# Patient Record
Sex: Female | Born: 1960 | Race: Black or African American | Hispanic: No | Marital: Single | Smoking: Never smoker
Health system: Southern US, Community
[De-identification: ages and names within clinical notes are randomized; demographics above are authoritative.]

## PROBLEM LIST (undated history)

## (undated) DIAGNOSIS — F419 Anxiety disorder, unspecified: Secondary | ICD-10-CM

## (undated) DIAGNOSIS — E785 Hyperlipidemia, unspecified: Secondary | ICD-10-CM

## (undated) DIAGNOSIS — F329 Major depressive disorder, single episode, unspecified: Secondary | ICD-10-CM

## (undated) DIAGNOSIS — F32A Depression, unspecified: Secondary | ICD-10-CM

## (undated) HISTORY — PX: CYST REMOVAL NECK: SHX6281

## (undated) HISTORY — DX: Major depressive disorder, single episode, unspecified: F32.9

## (undated) HISTORY — DX: Anxiety disorder, unspecified: F41.9

## (undated) HISTORY — DX: Depression, unspecified: F32.A

---

## 2016-12-02 ENCOUNTER — Ambulatory Visit (HOSPITAL_COMMUNITY)
Admission: RE | Admit: 2016-12-02 | Discharge: 2016-12-02 | Disposition: A | Discharge status: Home / Self Care | Payer: Self-pay | Source: Ambulatory Visit | Attending: Nurse Practitioner | Admitting: Nurse Practitioner

## 2016-12-02 ENCOUNTER — Encounter: Payer: Self-pay | Admitting: Nurse Practitioner

## 2016-12-02 ENCOUNTER — Ambulatory Visit: Payer: Self-pay | Attending: Nurse Practitioner | Admitting: Nurse Practitioner

## 2016-12-02 VITALS — BP 95/64 | HR 81 | Temp 98.7°F | Resp 18 | Ht 64.0 in | Wt 132.2 lb

## 2016-12-02 DIAGNOSIS — R51 Headache: Secondary | ICD-10-CM

## 2016-12-02 DIAGNOSIS — R519 Headache, unspecified: Secondary | ICD-10-CM

## 2016-12-02 DIAGNOSIS — F329 Major depressive disorder, single episode, unspecified: Secondary | ICD-10-CM

## 2016-12-02 DIAGNOSIS — F32A Depression, unspecified: Secondary | ICD-10-CM | POA: Insufficient documentation

## 2016-12-02 DIAGNOSIS — F419 Anxiety disorder, unspecified: Secondary | ICD-10-CM
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
## 2016-12-03 ENCOUNTER — Telehealth: Payer: Self-pay

## 2016-12-03 LAB — CBC
HEMATOCRIT: 46.3 % (ref 34.0–46.6)
Hemoglobin: 15 g/dL (ref 11.1–15.9)
MCH: 28.2 pg (ref 26.6–33.0)
MCHC: 32.4 g/dL (ref 31.5–35.7)
MCV: 87 fL (ref 79–97)
Platelets: 231 10*3/uL (ref 150–379)
RBC: 5.32 x10E6/uL — ABNORMAL HIGH (ref 3.77–5.28)
RDW: 13.5 % (ref 12.3–15.4)
WBC: 5.6 10*3/uL (ref 3.4–10.8)

## 2016-12-03 LAB — BASIC METABOLIC PANEL
BUN/Creatinine Ratio: 14 (ref 9–23)
BUN: 10 mg/dL (ref 6–24)
CALCIUM: 9 mg/dL (ref 8.7–10.2)
CO2: 28 mmol/L (ref 20–29)
CREATININE: 0.72 mg/dL (ref 0.57–1.00)
Chloride: 101 mmol/L (ref 96–106)
GFR, EST AFRICAN AMERICAN: 107 mL/min/{1.73_m2} (ref >59)
GFR, EST NON AFRICAN AMERICAN: 93 mL/min/{1.73_m2} (ref >59)
Glucose: 115 mg/dL — ABNORMAL HIGH (ref 65–99)
POTASSIUM: 4.7 mmol/L (ref 3.5–5.2)
Sodium: 142 mmol/L (ref 134–144)

## 2016-12-03 LAB — TSH: TSH: 1.01 u[IU]/mL (ref 0.450–4.500)

## 2016-12-03 NOTE — Telephone Encounter (Signed)
Patient informed on lab result. Patient is aware to continue eating healthy and exercise.   Patient verified DOB.

## 2016-12-03 NOTE — Telephone Encounter (Signed)
-----   Message from Claiborne RiggZelda W Fleming, NP sent at 12/03/2016 11:05 AM EST ----- Please inform patient that laboratory results are essentially normal. Continue healthy eating habit and regular physical exercise at least 3 times a week, 50 minutes each time.

## 2016-12-07 ENCOUNTER — Other Ambulatory Visit: Payer: Self-pay | Admitting: Nurse Practitioner

## 2016-12-07 ENCOUNTER — Telehealth: Payer: Self-pay

## 2016-12-07 MED ORDER — IBUPROFEN 800 MG PO TABS: 800.0000 mg | ORAL_TABLET | Freq: Three times a day (TID) | ORAL | 0 refills | Status: AC | PRN

## 2016-12-07 NOTE — Telephone Encounter (Signed)
Patient informed on imaging result. Patient is aware to prescription been sent to the pharmacy.  Patient verified DOB.

## 2016-12-07 NOTE — Telephone Encounter (Signed)
I just called the patient and she stated that there is no more additional paperwork.
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
## 2016-12-07 NOTE — Telephone Encounter (Signed)
I was waiting for her son to bring the additional paperwork. Did you leave it?

## 2016-12-09 NOTE — Telephone Encounter (Signed)
Ok paperwork will be ready on Monday at the front desk

## 2016-12-16 ENCOUNTER — Ambulatory Visit: Payer: Self-pay | Attending: Nurse Practitioner

## 2017-01-01 ENCOUNTER — Telehealth: Payer: Self-pay | Admitting: Nurse Practitioner

## 2017-01-01 NOTE — Telephone Encounter (Signed)
Pt. Called requesting to speak with her PCP regarding her x-ray results. Pt. States she has questions. Please f/u
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
## 2017-01-06 NOTE — Telephone Encounter (Signed)
Please let Ms. Lequita HaltMorgan know I will call her on Friday around lunchtime. Thank you

## 2017-01-07 NOTE — Telephone Encounter (Signed)
Patient called and wanted to verified what time you will be calling her. Patient informed during lunch time from 12:30 to1:30 pm.

## 2017-01-07 NOTE — Telephone Encounter (Signed)
Left a message for patient stating you will called on Friday around lunchtime.

## 2017-01-08 ENCOUNTER — Other Ambulatory Visit: Payer: Self-pay | Admitting: Nurse Practitioner

## 2017-01-08 DIAGNOSIS — R220 Localized swelling, mass and lump, head: Secondary | ICD-10-CM

## 2017-01-08 NOTE — Telephone Encounter (Signed)
Attempted to call Ms. Sandra Miranda. No answer. LVM

## 2017-01-08 NOTE — Progress Notes (Signed)
Called patient and called radiology to schedule her CT SCAN on Wednesday 01/21/2016 @3 :30 pm at [facility].

## 2017-01-08 NOTE — Progress Notes (Signed)
Noted! Thank you

## 2017-01-11 ENCOUNTER — Ambulatory Visit (HOSPITAL_COMMUNITY): Payer: Self-pay

## 2017-01-20 ENCOUNTER — Ambulatory Visit (HOSPITAL_COMMUNITY)
Admission: RE | Admit: 2017-01-20 | Discharge: 2017-01-20 | Disposition: A | Discharge status: Home / Self Care | Payer: Self-pay | Source: Ambulatory Visit | Attending: Nurse Practitioner | Admitting: Nurse Practitioner

## 2017-01-20 DIAGNOSIS — R220 Localized swelling, mass and lump, head: Secondary | ICD-10-CM | POA: Insufficient documentation

## 2017-01-21 ENCOUNTER — Telehealth: Payer: Self-pay

## 2017-01-21 NOTE — Telephone Encounter (Signed)
-----   Message from Claiborne RiggZelda W Fleming, NP sent at 01/20/2017 10:09 PM EST ----- CT is negative for any fractures

## 2017-01-21 NOTE — Telephone Encounter (Signed)
Patient informed on lab result.   Patient verified DOB.  

## 2017-01-22 ENCOUNTER — Telehealth: Payer: Self-pay | Admitting: Nurse Practitioner

## 2017-01-22 NOTE — Telephone Encounter (Signed)
Patient called and said she would like to speak with you. She would not tell me what is going on.  Please fu with patient.

## 2017-01-29 ENCOUNTER — Telehealth: Payer: Self-pay | Admitting: Nurse Practitioner

## 2017-01-29 NOTE — Telephone Encounter (Signed)
I try to called Pt to talk about the incomplete request for medical records, the form did not have to whom do we need to sent the records and also the date she is requesting was from  01/20/17 to 06/20/17, we are on the month of January , please if the pt call back she need to do another full request for records

## 2017-02-05 ENCOUNTER — Telehealth: Payer: Self-pay | Admitting: Nurse Practitioner

## 2017-02-05 NOTE — Telephone Encounter (Signed)
Patient called asking about [employer] paperwork. Patient stated she needed paperwork back to turn in asap. Patient ststed to call her once it is ready.

## 2017-02-05 NOTE — Telephone Encounter (Signed)
Will route to PCP 

## 2017-03-12 ENCOUNTER — Ambulatory Visit: Payer: Self-pay | Admitting: Nurse Practitioner

## 2017-05-12 ENCOUNTER — Telehealth: Payer: Self-pay | Admitting: Nurse Practitioner

## 2017-05-12 NOTE — Telephone Encounter (Signed)
Patient called asking for a referral for the eye doctor

## 2017-05-14 NOTE — Telephone Encounter (Signed)
Per Deisy, patient have [employer] and orange card till 08/05/2017.

## 2017-05-14 NOTE — Telephone Encounter (Signed)
Does patient have any financial assistance through [facility]?

## 2017-05-17 ENCOUNTER — Other Ambulatory Visit: Payer: Self-pay | Admitting: Nurse Practitioner

## 2017-05-17 DIAGNOSIS — Z0100 Encounter for examination of eyes and vision without abnormal findings: Secondary | ICD-10-CM

## 2017-05-17 NOTE — Telephone Encounter (Signed)
Referral has been made.

## 2017-05-19 ENCOUNTER — Other Ambulatory Visit: Payer: Self-pay | Admitting: Nurse Practitioner

## 2017-05-19 ENCOUNTER — Telehealth: Payer: Self-pay | Admitting: Nurse Practitioner

## 2017-05-19 DIAGNOSIS — K089 Disorder of teeth and supporting structures, unspecified: Secondary | ICD-10-CM

## 2017-05-19 NOTE — Telephone Encounter (Signed)
Dental referral has been placed. It will take several weeks to be processed. She can also contact [employer] and request services or if this is for routine cleaning she can call any of the local community college [employer] programs.

## 2017-05-19 NOTE — Telephone Encounter (Signed)
Will route to PCP 

## 2017-05-19 NOTE — Telephone Encounter (Signed)
Patient called and requested for a dental referral. Please fu at your earliest convenience.

## 2017-05-19 NOTE — Telephone Encounter (Signed)
CMA called patient to inform referral has been made.  Patient understood.

## 2017-10-15 ENCOUNTER — Ambulatory Visit: Payer: Medicare Other | Attending: Nurse Practitioner | Admitting: Nurse Practitioner

## 2017-10-15 ENCOUNTER — Encounter: Payer: Self-pay | Admitting: Nurse Practitioner

## 2017-10-15 VITALS — BP 107/72 | HR 75 | Temp 98.3°F | Ht 64.0 in | Wt 136.6 lb

## 2017-10-15 DIAGNOSIS — Z1231 Encounter for screening mammogram for malignant neoplasm of breast: Secondary | ICD-10-CM

## 2017-10-15 DIAGNOSIS — Z833 Family history of diabetes mellitus: Secondary | ICD-10-CM | POA: Insufficient documentation

## 2017-10-15 DIAGNOSIS — F329 Major depressive disorder, single episode, unspecified: Secondary | ICD-10-CM | POA: Diagnosis not present

## 2017-10-15 DIAGNOSIS — G609 Hereditary and idiopathic neuropathy, unspecified: Secondary | ICD-10-CM

## 2017-10-15 DIAGNOSIS — Z79899 Other long term (current) drug therapy: Secondary | ICD-10-CM | POA: Insufficient documentation

## 2017-10-15 DIAGNOSIS — Z1211 Encounter for screening for malignant neoplasm of colon: Secondary | ICD-10-CM | POA: Diagnosis not present

## 2017-10-15 DIAGNOSIS — F419 Anxiety disorder, unspecified: Secondary | ICD-10-CM | POA: Insufficient documentation

## 2017-10-15 DIAGNOSIS — R413 Other amnesia: Secondary | ICD-10-CM | POA: Diagnosis not present

## 2017-10-15 DIAGNOSIS — Z0000 Encounter for general adult medical examination without abnormal findings: Secondary | ICD-10-CM | POA: Diagnosis not present

## 2017-10-15 DIAGNOSIS — R200 Anesthesia of skin: Secondary | ICD-10-CM | POA: Diagnosis present

## 2017-10-15 NOTE — Patient Instructions (Signed)
Dementia Dementia is the loss of two or more brain functions, such as:  Memory.  Decision making.  Behavior.  Speaking.  Thinking.  Problem solving.  There are many types of dementia. The most common type is called progressive dementia. Progressive dementia gets worse with time and it is irreversible. An example of this type of dementia is Alzheimer disease. What are the causes? This condition may be caused by:  Nerve cell damage in the brain.  Genetic mutations.  Certain medicines.  Multiple small strokes.  An infection, such as chronic meningitis.  A metabolic problem, such as vitamin B12 deficiency or thyroid disease.  Pressure on the brain, such as from a tumor or blood clot.  What are the signs or symptoms? Symptoms of this condition include:  Sudden changes in mood.  Depression.  Problems with balance.  Changes in personality.  Poor short-term memory.  Agitation.  Delusions.  Hallucinations.  Having a hard time: ? Speaking thoughts. ? Finding words. ? Solving problems. ? Doing familiar tasks. ? Understanding familiar ideas.  How is this diagnosed? This condition is diagnosed with an assessment by your health care provider. During this assessment, your health care provider will talk with you and your family, friends, or caregivers about your symptoms. A thorough medical history will be taken, and you will have a physical exam and tests. Tests may include:  Lab tests, such as blood or urine tests.  Imaging tests, such as a CT scan, PET scan, or MRI.  A lumbar puncture. This test involves removing and testing a small amount of the fluid that surrounds the brain and spinal cord.  An electroencephalogram (EEG). In this test, small metal discs are used to measure electrical activity in the brain.  Memory tests, cognitive tests, and neuropsychological tests. These tests evaluate brain function.  How is this treated? Treatment depends on the  cause of the dementia. It may involve taking medicines that may help:  To control the dementia.  To slow down the disease.  To manage symptoms.  In some cases, treating the cause of the dementia can improve symptoms, reverse symptoms, or slow down how quickly the dementia gets worse. Your health care provider can help direct you to support groups, organizations, and other health care providers who can help with decisions about your care. Follow these instructions at home: Medicine  Take over-the-counter and prescription medicines only as told by your health care provider.  Avoid taking medicines that can affect thinking, such as pain or sleeping medicines. Lifestyle   Make healthy lifestyle choices: ? Be physically active as told by your health care provider. ? Do not use any tobacco products, such as cigarettes, chewing tobacco, and e-cigarettes. If you need help quitting, ask your health care provider. ? Eat a healthy diet. ? Practice stress-management techniques when you get stressed. ? Stay social.  Drink enough fluid to keep your urine clear or pale yellow.  Make sure to get quality sleep. These tips can help you to get a good night's rest: ? Avoid napping during the day. ? Keep your sleeping area dark and cool. ? Avoid exercising during the few hours before you go to bed. ? Avoid caffeine products in the evening. General instructions  Work with your health care provider to determine what you need help with and what your safety needs are.  If you were given a bracelet that tracks your location, make sure to wear it.  Keep all follow-up visits as told by your   health care provider. This is important. Contact a health care provider if:  You have any new symptoms.  You have problems with choking or swallowing.  You have any symptoms of a different illness. Get help right away if:  You develop a fever.  You have new or worsening confusion.  You have new or  worsening sleepiness.  You have a hard time staying awake.  You or your family members become concerned for your safety. This information is not intended to replace advice given to you by your health care provider. Make sure you discuss any questions you have with your health care provider. Document Released: 07/01/2000 Document Revised: 05/16/2015 Document Reviewed: 10/03/2014 Elsevier Interactive Patient Education  2018 Elsevier Inc.  

## 2017-10-15 NOTE — Progress Notes (Signed)
Assessment & Plan:  Sandra Miranda was seen today for numbness.  Diagnoses and all orders for this visit:  Idiopathic peripheral neuropathy -     CBC -     CMP14+EGFR -     Magnesium  Colon cancer screening -     Fecal occult blood, imunochemical([facility])  Breast cancer screening by mammogram -     MM 3D SCREEN BREAST BILATERAL; Future  Memory loss -     Lipid panel -     B12 and Folate Panel    Patient has been counseled on age-appropriate routine health concerns for screening and prevention. These are reviewed and up-to-date. Referrals have been placed accordingly. Immunizations are up-to-date or declined.    Subjective:   Chief Complaint  Patient presents with  . Numbness    Pt. stated she have tingling feelings on both of feets. Pt. also stated she sometime forgets things easily.    HPI Sandra Miranda 57 y.o. female presents to office today with complaints of "memory loss" and peripheral neuropathy.   Memory Loss She is here for evaluation and treatment of cognitive problems. She endorses one episode in which she was at the grocery store and she "cashed out" and forgot all of her groceries and left them sitting in the grocery store. Another episode she didn't realize she has placed her groceries on the scanner and asked the person at the register who the groceries belonged to. Onset 3 months ago. She has not started any new medication.  She is able to recall the month, year and day of the week.  The patient identify problems with changes in short term memory. Medication administration: patient self medicates   Functional Assessment:  Activities of Daily Living (ADLs):   She is independent in the following: ambulation, bathing and hygiene, feeding, continence, grooming, toileting and dressing Requires assistance with the following: None Instrumental Activities of Daily Living (IADLs):   She is independent in the following: all IADLS Requires assistance with the  following: None    Neuropathy She endorses tingling and sharp pain in her bilateral feet at night. She uses rubbing alcohol on her feet for the pain to go away. Pain can last for hours. Onset was "a long time ago". Symptoms are currently of moderate severity.  The patient denies lancinating pain, cramping and squeezing. Symptoms are symmetric. Previous treatment has included NONE.   Review of Systems  Constitutional: Negative for fever, malaise/fatigue and weight loss.  HENT: Negative.  Negative for nosebleeds.   Eyes: Negative.  Negative for blurred vision, double vision and photophobia.  Respiratory: Negative.  Negative for cough and shortness of breath.   Cardiovascular: Negative.  Negative for chest pain, palpitations and leg swelling.  Gastrointestinal: Negative.  Negative for heartburn, nausea and vomiting.  Musculoskeletal: Negative.  Negative for myalgias.  Neurological: Positive for tingling. Negative for dizziness, focal weakness, seizures and headaches.  Psychiatric/Behavioral: Positive for depression. Negative for suicidal ideas. The patient is nervous/anxious.     Past Medical History:  Diagnosis Date  . Anxiety   . Depression     History reviewed. No pertinent surgical history.  Family History  Problem Relation Age of Onset  . Diabetes Mother   . Diabetes Father     Social History Reviewed with no changes to be made today.   Outpatient Medications Prior to Visit  Medication Sig Dispense Refill  . Lurasidone HCl (LATUDA) 60 MG TABS Take 60 mg at bedtime by mouth.    . sertraline (  ZOLOFT) 100 MG tablet Take 100 mg daily by mouth.    . traZODone (DESYREL) 50 MG tablet Take 50 mg at bedtime by mouth.    Marland Kitchen ibuprofen (ADVIL,MOTRIN) 800 MG tablet Take 1 tablet (800 mg total) every 8 (eight) hours as needed by mouth. (Patient not taking: Reported on 10/15/2017) 30 tablet 0   No facility-administered medications prior to visit.     Not on File     Objective:      BP 107/72 (BP Location: Left Arm, Patient Position: Sitting, Cuff Size: Normal)   Pulse 75   Temp 98.3 F (36.8 C) (Oral)   Ht _0  (1.626 m)   Wt 136 lb 9.6 oz (62 kg)   LMP  (Approximate)   SpO2 97%   BMI 23.45 kg/m  Wt Readings from Last 3 Encounters:  10/15/17 136 lb 9.6 oz (62 kg)  12/02/16 132 lb 3.2 oz (60 kg)    Physical Exam  Constitutional: She is oriented to person, place, and time. She appears well-developed and well-nourished. She is cooperative.  HENT:  Head: Normocephalic and atraumatic.  Eyes: EOM are normal.  Neck: Normal range of motion.  Cardiovascular: Normal rate, regular rhythm and normal heart sounds. Exam reveals no gallop and no friction rub.  No murmur heard. Pulses:      Dorsalis pedis pulses are 2+ on the right side, and 2+ on the left side.       Posterior tibial pulses are 2+ on the right side, and 2+ on the left side.  Pulmonary/Chest: Effort normal and breath sounds normal. No tachypnea. No respiratory distress. She has no decreased breath sounds. She has no wheezes. She has no rhonchi. She has no rales. She exhibits no tenderness.  Abdominal: Bowel sounds are normal.  Musculoskeletal: Normal range of motion. She exhibits no edema.  Feet:  Right Foot:  Protective Sensation: 10 sites tested. 10 sites sensed.  Skin Integrity: Negative for skin breakdown.  Left Foot:  Protective Sensation: 10 sites sensed.  Skin Integrity: Negative for skin breakdown.  Neurological: She is alert and oriented to person, place, and time. Coordination and gait normal.  Skin: Skin is warm and dry.  Psychiatric: She has a normal mood and affect. Her behavior is normal. Judgment and thought content normal.  Nursing note and vitals reviewed.      Patient has been counseled extensively about nutrition and exercise as well as the importance of adherence with medications and regular follow-up. The patient was given clear instructions to go to ER or return to medical  center if symptoms don't improve, worsen or new problems develop. The patient verbalized understanding.   Follow-up: Return for PAP and physical first available.   Gildardo Pounds, FNP-BC [facility] [place], [place]   10/15/2017, 7:24 PM

## 2017-10-16 LAB — CBC
HEMATOCRIT: 41.9 % (ref 34.0–46.6)
HEMOGLOBIN: 13.4 g/dL (ref 11.1–15.9)
MCH: 27.5 pg (ref 26.6–33.0)
MCHC: 32 g/dL (ref 31.5–35.7)
MCV: 86 fL (ref 79–97)
Platelets: 222 10*3/uL (ref 150–450)
RBC: 4.88 x10E6/uL (ref 3.77–5.28)
RDW: 14.9 % (ref 12.3–15.4)
WBC: 5.1 10*3/uL (ref 3.4–10.8)

## 2017-10-16 LAB — LIPID PANEL
CHOL/HDL RATIO: 4.5 ratio — AB (ref 0.0–4.4)
Cholesterol, Total: 265 mg/dL — ABNORMAL HIGH (ref 100–199)
HDL: 59 mg/dL (ref >39)
LDL Calculated: 144 mg/dL — ABNORMAL HIGH (ref 0–99)
TRIGLYCERIDES: 309 mg/dL — AB (ref 0–149)
VLDL CHOLESTEROL CAL: 62 mg/dL — AB (ref 5–40)

## 2017-10-16 LAB — CMP14+EGFR
ALBUMIN: 4.5 g/dL (ref 3.5–5.5)
ALT: 19 IU/L (ref 0–32)
AST: 21 IU/L (ref 0–40)
Albumin/Globulin Ratio: 1.5 (ref 1.2–2.2)
Alkaline Phosphatase: 61 IU/L (ref 39–117)
BUN / CREAT RATIO: 16 (ref 9–23)
BUN: 13 mg/dL (ref 6–24)
Bilirubin Total: <0.2 mg/dL (ref 0.0–1.2)
CALCIUM: 10.3 mg/dL — AB (ref 8.7–10.2)
CO2: 25 mmol/L (ref 20–29)
CREATININE: 0.83 mg/dL (ref 0.57–1.00)
Chloride: 100 mmol/L (ref 96–106)
GFR calc Af Amer: 89 mL/min/{1.73_m2} (ref >59)
GFR, EST NON AFRICAN AMERICAN: 77 mL/min/{1.73_m2} (ref >59)
GLUCOSE: 55 mg/dL — AB (ref 65–99)
Globulin, Total: 3.1 g/dL (ref 1.5–4.5)
Potassium: 4.8 mmol/L (ref 3.5–5.2)
Sodium: 144 mmol/L (ref 134–144)
Total Protein: 7.6 g/dL (ref 6.0–8.5)

## 2017-10-16 LAB — MAGNESIUM: Magnesium: 2.2 mg/dL (ref 1.6–2.3)

## 2017-10-16 LAB — B12 AND FOLATE PANEL
Folate: 15.1 ng/mL (ref >3.0)
VITAMIN B 12: 370 pg/mL (ref 232–1245)

## 2017-10-17 ENCOUNTER — Other Ambulatory Visit: Payer: Self-pay | Admitting: Nurse Practitioner

## 2017-10-17 MED ORDER — ATORVASTATIN CALCIUM 20 MG PO TABS: 20.0000 mg | ORAL_TABLET | Freq: Every day | ORAL | 3 refills | Status: AC

## 2017-10-20 ENCOUNTER — Telehealth: Payer: Self-pay

## 2017-10-20 ENCOUNTER — Telehealth: Payer: Self-pay | Admitting: Nurse Practitioner

## 2017-10-20 NOTE — Telephone Encounter (Signed)
Patient called to request her lab results, verified DOB and received message left by her nurse. She had no further questions or concerns

## 2017-10-20 NOTE — Telephone Encounter (Signed)
CMA attempt to reach patient to inform on results.  No answer and unable to leave a VM due no mailbox has been set up.  If patient call, please inform:  Magnesium and B12 are normal. Tests show increased cholesterol/lipid levels. Will need to start on statin or cholesterol/lipid lowering medication. Prescription has been sent to the pharmacy. Work on a low fat, heart healthy diet and participate in regular aerobic exercise program by working out at least 150 minutes per week; 5 days a week-30 minutes per day. Avoid red meat, fried foods. junk foods, sodas, sugary drinks, unhealthy snacking, alcohol and smoking.  Drink at least 48oz of water per day and monitor your carbohydrate intake daily.  A letter will be send out to reach patient.

## 2017-10-20 NOTE — Telephone Encounter (Signed)
Thank you :)

## 2017-10-20 NOTE — Telephone Encounter (Signed)
-----   Message from Claiborne Rigg, NP sent at 10/17/2017  7:08 PM EDT ----- Magnesium and B12 are normal. Tests show increased cholesterol/lipid levels. Will need to start on statin or cholesterol/lipid lowering medication. Prescription has been sent to the pharmacy. Work on a low fat, heart healthy diet and participate in regular aerobic exercise program by working out at least 150 minutes per week; 5 days a week-30 minutes per day. Avoid red meat, fried foods. junk foods, sodas, sugary drinks, unhealthy snacking, alcohol and smoking.  Drink at least 48oz of water per day and monitor your carbohydrate intake daily.

## 2017-10-21 LAB — FECAL OCCULT BLOOD, IMMUNOCHEMICAL: FECAL OCCULT BLD: NEGATIVE

## 2017-10-25 ENCOUNTER — Telehealth: Payer: Self-pay

## 2017-10-25 NOTE — Telephone Encounter (Signed)
CMA spoke to patient to inform on results.  Patient understood. Patient verified DOB.  

## 2017-10-25 NOTE — Telephone Encounter (Signed)
-----   Message from Claiborne Rigg, NP sent at 10/23/2017  6:03 PM EDT ----- Stool test is normal. At this time you do not need a colonscopy

## 2017-11-17 ENCOUNTER — Encounter: Payer: Self-pay | Admitting: Nurse Practitioner

## 2017-11-17 ENCOUNTER — Ambulatory Visit (HOSPITAL_BASED_OUTPATIENT_CLINIC_OR_DEPARTMENT_OTHER): Payer: Medicare Other | Admitting: Nurse Practitioner

## 2017-11-17 ENCOUNTER — Other Ambulatory Visit (HOSPITAL_COMMUNITY)
Admission: RE | Admit: 2017-11-17 | Discharge: 2017-11-17 | Disposition: A | Discharge status: Home / Self Care | Payer: Medicare Other | Source: Ambulatory Visit | Attending: Nurse Practitioner | Admitting: Nurse Practitioner

## 2017-11-17 VITALS — BP 102/63 | HR 71 | Temp 97.9°F | Ht 64.0 in | Wt 139.8 lb

## 2017-11-17 DIAGNOSIS — Z01419 Encounter for gynecological examination (general) (routine) without abnormal findings: Secondary | ICD-10-CM | POA: Insufficient documentation

## 2017-11-17 DIAGNOSIS — Z79899 Other long term (current) drug therapy: Secondary | ICD-10-CM | POA: Insufficient documentation

## 2017-11-17 DIAGNOSIS — F419 Anxiety disorder, unspecified: Secondary | ICD-10-CM

## 2017-11-17 DIAGNOSIS — F329 Major depressive disorder, single episode, unspecified: Secondary | ICD-10-CM

## 2017-11-17 NOTE — Progress Notes (Signed)
Assessment & Plan:  [name] was seen today for annual exam.  Diagnoses and all orders for this visit:  Well woman exam with routine gynecological exam -     Cytology - PAP    Patient has been counseled on age-appropriate routine health concerns for screening and prevention. These are reviewed and up-to-date. Referrals have been placed accordingly. Immunizations are up-to-date or declined.    Subjective:   Chief Complaint  Patient presents with  . Annual Exam    Pt. is here for a physical and pap smear.    HPI Sandra Miranda 57 y.o. female presents to office today   Review of Systems  Constitutional: Negative.  Negative for chills, fever, malaise/fatigue and weight loss.  HENT: Negative.  Negative for congestion, hearing loss, sinus pain and sore throat.   Eyes: Negative.  Negative for blurred vision, double vision, photophobia and pain.  Respiratory: Negative.  Negative for cough, sputum production, shortness of breath and wheezing.   Cardiovascular: Negative.  Negative for chest pain and leg swelling.  Gastrointestinal: Negative.  Negative for abdominal pain, constipation, diarrhea, heartburn, nausea and vomiting.  Genitourinary: Negative.   Musculoskeletal: Negative.  Negative for joint pain and myalgias.  Skin: Negative.  Negative for rash.  Neurological: Negative.  Negative for dizziness, tremors, speech change, focal weakness, seizures and headaches.  Endo/Heme/Allergies: Negative.  Negative for environmental allergies.  Psychiatric/Behavioral: Negative.  Negative for depression and suicidal ideas. The patient is not nervous/anxious and does not have insomnia.     Past Medical History:  Diagnosis Date  . Anxiety   . Depression     History reviewed. No pertinent surgical history.  Family History  Problem Relation Age of Onset  . Diabetes Mother   . Diabetes Father     Social History Reviewed with no changes to be made today.   Outpatient Medications  Prior to Visit  Medication Sig Dispense Refill  . atorvastatin (LIPITOR) 20 MG tablet Take 1 tablet (20 mg total) by mouth daily. 90 tablet 3  . Lurasidone HCl (LATUDA) 60 MG TABS Take 60 mg at bedtime by mouth.    . sertraline (ZOLOFT) 100 MG tablet Take 100 mg daily by mouth.    . traZODone (DESYREL) 50 MG tablet Take 50 mg at bedtime by mouth.    Marland Kitchen ibuprofen (ADVIL,MOTRIN) 800 MG tablet Take 1 tablet (800 mg total) every 8 (eight) hours as needed by mouth. (Patient not taking: Reported on 10/15/2017) 30 tablet 0   No facility-administered medications prior to visit.     Not on File     Objective:    BP 102/63 (BP Location: Right Arm, Patient Position: Sitting, Cuff Size: Normal)   Pulse 71   Temp 97.9 F (36.6 C) (Oral)   Ht 5\' 4"  (1.626 m)   Wt 139 lb 12.8 oz (63.4 kg)   SpO2 99%   BMI 24.00 kg/m  Wt Readings from Last 3 Encounters:  11/17/17 139 lb 12.8 oz (63.4 kg)  10/15/17 136 lb 9.6 oz (62 kg)  12/02/16 132 lb 3.2 oz (60 kg)    Physical Exam  Constitutional: She is oriented to person, place, and time. She appears well-developed and well-nourished. No distress.  HENT:  Head: Normocephalic and atraumatic.  Right Ear: External ear normal.  Left Ear: External ear normal.  Nose: Nose normal.  Mouth/Throat: Oropharynx is clear and moist. No oropharyngeal exudate.  Eyes: Pupils are equal, round, and reactive to light. Conjunctivae and EOM are normal. Right  eye exhibits no discharge. Left eye exhibits no discharge. No scleral icterus.  Neck: Normal range of motion. Neck supple. No tracheal deviation present. No thyromegaly present.  Cardiovascular: Normal rate, regular rhythm, normal heart sounds and intact distal pulses. Exam reveals no friction rub.  No murmur heard. Pulmonary/Chest: Effort normal and breath sounds normal. No respiratory distress. She has no decreased breath sounds. She has no wheezes. She has no rhonchi. She has no rales. She exhibits no tenderness.  Right breast exhibits no inverted nipple, no mass, no nipple discharge, no skin change and no tenderness. Left breast exhibits no inverted nipple, no mass, no nipple discharge, no skin change and no tenderness.  Abdominal: Soft. Bowel sounds are normal. She exhibits no distension and no mass. There is no tenderness. There is no rebound and no guarding. Hernia confirmed negative in the right inguinal area and confirmed negative in the left inguinal area.  Genitourinary: Vagina normal. Rectal exam shows no external hemorrhoid, no fissure, no mass and anal tone normal. No labial fusion. There is no rash, tenderness, lesion or injury on the right labia. There is no rash, tenderness, lesion or injury on the left labia. Uterus is deviated. Uterus is not tender. Cervix exhibits no motion tenderness, no discharge and no friability. Right adnexum displays no mass, no tenderness and no fullness. Left adnexum displays no mass, no tenderness and no fullness. No erythema, tenderness or bleeding in the vagina. No vaginal discharge found.  Musculoskeletal: Normal range of motion. She exhibits no edema, tenderness or deformity.  Lymphadenopathy:    She has no cervical adenopathy.  Neurological: She is alert and oriented to person, place, and time. No cranial nerve deficit. Coordination normal.  Skin: Skin is warm and dry. No rash noted. She is not diaphoretic. No erythema. No pallor.  Psychiatric: She has a normal mood and affect. Her behavior is normal. Judgment and thought content normal.       Patient has been counseled extensively about nutrition and exercise as well as the importance of adherence with medications and regular follow-up. The patient was given clear instructions to go to ER or return to medical center if symptoms don't improve, worsen or new problems develop. The patient verbalized understanding.   Follow-up: Return in about 6 months (around 05/19/2018).   Claiborne Rigg, FNP-BC [facility] [place], [place] [phone]   11/17/2017, 2:23 PM

## 2017-11-17 NOTE — Patient Instructions (Signed)
Pap Test Why am I having this test? A pap test is sometimes called a pap smear. It is a screening test that is used to check for signs of cancer of the vagina, cervix, and uterus. The test can also identify the presence of infection or precancerous changes. Your health care provider will likely recommend you have this test done on a regular basis. This test may be done:  Every 3 years, starting at age 59.  Every 5 years, in combination with testing for the presence of human papillomavirus (HPV).  More or less often depending on other medical conditions.  What kind of sample is taken? Using a small cotton swab, plastic spatula, or brush, your health care provider will collect a sample of cells from the surface of your cervix. Your cervix is the opening to your uterus, also called a womb. Secretions from the cervix and vagina may also be collected. How do I prepare for this test?  Be aware of where you are in your menstrual cycle. You may be asked to reschedule the test if you are menstruating on the day of the test.  You may need to reschedule if you have a known vaginal infection on the day of the test.  You may be asked to avoid douching or taking a bath the day before or the day of the test.  Some medicines can cause abnormal test results, such as digitalis and tetracycline. Talk with your health care provider before your test if you take one of these medicines. What do the results mean? Abnormal test results may indicate a number of health conditions. These may include:  Cancer. Although pap test results cannot be used to diagnose cancer of the cervix, vagina, or uterus, they may suggest the possibility of cancer. Further tests would be required to determine if cancer is present.  Sexually transmitted disease.  Fungal infection.  Parasite infection.  Herpes infection.  A condition causing or contributing to infertility.  It is your responsibility to obtain your test results.  Ask the lab or department performing the test when and how you will get your results. Contact your health care provider to discuss any questions you have about your results. Talk with your health care provider to discuss your results, treatment options, and if necessary, the need for more tests. Talk with your health care provider if you have any questions about your results. This information is not intended to replace advice given to you by your health care provider. Make sure you discuss any questions you have with your health care provider. Document Released: 03/28/2002 Document Revised: 09/11/2015 Document Reviewed: 05/29/2013 Elsevier Interactive Patient Education  2018 Elsevier Inc.  Preventing Cervical Cancer Cervical cancer is cancer that grows on the cervix. The cervix is at the bottom of the uterus. It connects the uterus to the vagina. The uterus is where a baby develops during pregnancy. Cancer occurs when cells become abnormal and start to grow out of control. Cervical cancer grows slowly and may not cause any symptoms at first. Over time, the cancer can grow deep into the cervix tissue and spread to other areas. If it is found early, cervical cancer can be treated effectively. You can also take steps to prevent this type of cancer. Most cases of cervical cancer are caused by an STI (sexually transmitted infection) called human papillomavirus (HPV). One way to reduce your risk of cervical cancer is to avoid infection with the HPV virus. You can do this by practicing safe   sex and by getting the HPV vaccine. Getting regular Pap tests is also important because this can help identify changes in cells that could lead to cancer. Your chances of getting this disease can also be reduced by making certain lifestyle changes. How can I protect myself from cervical cancer? Preventing HPV infection  Ask your health care provider about getting the HPV vaccine. If you are 26 years old or younger, you may  need to get this vaccine, which is given in three doses over 6 months. This vaccine protects against the types of HPV that could cause cancer.  Limit the number of people you have sex with. Also avoid having sex with people who have had many sex partners.  Use a latex condom during sex. Getting Pap tests  Get Pap tests regularly, starting at age 59. Talk with your health care provider about how often you need these tests. ? Most women who are 21?59 years of age should have a Pap test every 3 years. ? Most women who are 30?59 years of age should have a Pap test in combination with an HPV test every 5 years. ? Women with a higher risk of cervical cancer, such as those with a weakened immune system or those who have been exposed to the drug diethylstilbestrol (DES), may need more frequent testing. Making other lifestyle changes  Do not use any products that contain nicotine or tobacco, such as cigarettes and e-cigarettes. If you need help quitting, ask your health care provider.  Eat at least 5 servings of fruits and vegetables every day.  Lose weight if you are overweight. Why are these changes important?  These changes and screening tests are designed to address the factors that are known to increase the risk of cervical cancer. Taking these steps is the best way to reduce your risk.  Having regular Pap tests will help identify changes in cells that could lead to cancer. Steps can then be taken to prevent cancer from developing.  These changes will also help find cervical cancer early. This type of cancer can be treated effectively if it is found early. It can be more dangerous and difficult to treat if cancer has grown deep into your cervix or has spread.  In addition to making you less likely to get cervical cancer, these changes will also provide other health benefits, such as the following: ? Practicing safe sex is important for preventing STIs and unplanned pregnancies. ? Avoiding  tobacco can reduce your risk for other cancers and health issues. ? Eating a healthy diet and maintaining a healthy weight are good for your overall health. What can happen if changes are not made? In the early stages, cervical cancer might not have any symptoms. It can take many years for the cancer to grow and get deep into the cervix tissue. This may be happening without you knowing about it. If you develop any symptoms, such as pelvic pain or unusual discharge or bleeding from your vagina, you should see your health care provider right away. If cervical cancer is not found early, you might need treatments such as radiation, chemotherapy, or surgery. In some cases, surgery may mean that you will not be able to get pregnant or carry a pregnancy to term. Where to find support: Talk with your health care provider, school nurse, or local health department for guidance about screening and vaccination. Some children and teens may be able to get the HPV vaccine free of charge through the U.S. government's   Vaccines for Children (VFC) program. Other places that provide vaccinations include:  Public health clinics. Check with your local health department.  Federally Qualified Health Centers, where you would pay only what you can afford. To find one near you, check this website: www.fqhc.org/find-an-fqhc/  Rural Health Clinics. These are part of a program for Medicare and Medicaid patients who live in rural areas.  The National Breast and Cervical Cancer Early Detection Program also provides breast and cervical cancer screenings and diagnostic services to low-income, uninsured, and underinsured women. Cervical cancer can be passed down through families. Talk with your health care provider or genetic counselor to learn more about genetic testing for cancer. Where to find more information: Learn more about cervical cancer from:  American College of Gynecology:  www.acog.org/Patients/FAQs/Cervical-Cancer  American Cancer Society: www.cancer.org/cancer/cervicalcancer/  U.S. Centers for Disease Control and Prevention: www.cdc.gov/cancer/cervical/  Summary  Talk with your health care provider about getting the HPV vaccine.  Be sure to get regular Pap tests as recommended by your health care provider.  See your health care provider right away if you have any pelvic pain or unusual discharge or bleeding from your vagina. This information is not intended to replace advice given to you by your health care provider. Make sure you discuss any questions you have with your health care provider. Document Released: 01/20/2015 Document Revised: 09/03/2015 Document Reviewed: 09/03/2015 Elsevier Interactive Patient Education  2018 Elsevier Inc.  

## 2017-11-19 LAB — CYTOLOGY - PAP
Bacterial vaginitis: NEGATIVE
CHLAMYDIA, DNA PROBE: NEGATIVE
Candida vaginitis: NEGATIVE
Diagnosis: NEGATIVE
HPV (WINDOPATH): NOT DETECTED
NEISSERIA GONORRHEA: NEGATIVE
TRICH (WINDOWPATH): NEGATIVE

## 2017-11-23 ENCOUNTER — Telehealth: Payer: Self-pay

## 2017-11-23 NOTE — Telephone Encounter (Signed)
-----   Message from Claiborne Rigg, NP sent at 11/19/2017  3:12 PM EDT ----- PAP smear is normal. Next PAP due 2022

## 2017-11-23 NOTE — Telephone Encounter (Signed)
CMA spoke to patient to inform on results.   Pt. Verified DOB.  Pt.understood.  

## 2017-12-02 ENCOUNTER — Ambulatory Visit
Admission: RE | Admit: 2017-12-02 | Discharge: 2017-12-02 | Disposition: A | Discharge status: Home / Self Care | Payer: Medicare Other | Source: Ambulatory Visit | Attending: Nurse Practitioner | Admitting: Nurse Practitioner

## 2017-12-02 DIAGNOSIS — Z1231 Encounter for screening mammogram for malignant neoplasm of breast: Secondary | ICD-10-CM

## 2018-03-03 DIAGNOSIS — F251 Schizoaffective disorder, depressive type: Secondary | ICD-10-CM | POA: Diagnosis not present

## 2018-03-03 DIAGNOSIS — F209 Schizophrenia, unspecified: Secondary | ICD-10-CM | POA: Diagnosis not present

## 2018-03-03 DIAGNOSIS — F22 Delusional disorders: Secondary | ICD-10-CM | POA: Diagnosis not present

## 2018-03-03 DIAGNOSIS — F333 Major depressive disorder, recurrent, severe with psychotic symptoms: Secondary | ICD-10-CM | POA: Diagnosis not present

## 2018-03-21 ENCOUNTER — Encounter (HOSPITAL_COMMUNITY): Payer: Self-pay | Admitting: *Deleted

## 2018-03-21 ENCOUNTER — Emergency Department (HOSPITAL_COMMUNITY)
Admission: EM | Admit: 2018-03-21 | Discharge: 2018-03-21 | Disposition: A | Discharge status: Home / Self Care | Payer: Medicare Other | Attending: Emergency Medicine | Admitting: Emergency Medicine

## 2018-03-21 ENCOUNTER — Other Ambulatory Visit: Payer: Self-pay

## 2018-03-21 DIAGNOSIS — F419 Anxiety disorder, unspecified: Secondary | ICD-10-CM

## 2018-03-21 DIAGNOSIS — R457 State of emotional shock and stress, unspecified: Secondary | ICD-10-CM | POA: Diagnosis not present

## 2018-03-21 DIAGNOSIS — Z79899 Other long term (current) drug therapy: Secondary | ICD-10-CM | POA: Insufficient documentation

## 2018-03-21 DIAGNOSIS — R000 Tachycardia, unspecified: Secondary | ICD-10-CM | POA: Diagnosis not present

## 2018-03-21 DIAGNOSIS — I10 Essential (primary) hypertension: Secondary | ICD-10-CM | POA: Diagnosis not present

## 2018-03-21 LAB — CBG MONITORING, ED: GLUCOSE-CAPILLARY: 74 mg/dL (ref 70–99)

## 2018-03-21 MED ORDER — LORAZEPAM 2 MG/ML IJ SOLN
1.0000 mg | Freq: Once | INTRAMUSCULAR | Status: AC
  Administered 2018-03-21: 1 mg via INTRAMUSCULAR
  Filled 2018-03-21: qty 1

## 2018-03-21 NOTE — ED Provider Notes (Signed)
[facility] EMERGENCY DEPARTMENT Provider Note   CSN: [id number] Arrival date & time: 03/21/18  1548    History   Chief Complaint Chief Complaint  Patient presents with  . Anxiety    HPI Sandra Miranda is a 58 y.o. female.     58 year old female presents with anxiety attack that began after she had an argument with her relative.  Has a history of same and has not been compliant with her medications.  Denies any suicidal or homicidal ideations.  Has had increasing stress to her current living situation as well 2.  Began to hyperventilate and called EMS and was transported here.     Past Medical History:  Diagnosis Date  . Anxiety   . Depression     Patient Active Problem List   Diagnosis Date Noted  . Anxiety and depression 12/02/2016    History reviewed. No pertinent surgical history.   OB History   No obstetric history on file.      Home Medications    Prior to Admission medications   Medication Sig Start Date End Date Taking? Authorizing Provider  atorvastatin (LIPITOR) 20 MG tablet Take 1 tablet (20 mg total) by mouth daily. 10/17/17   Claiborne Rigg, NP  ibuprofen (ADVIL,MOTRIN) 800 MG tablet Take 1 tablet (800 mg total) every 8 (eight) hours as needed by mouth. Patient not taking: Reported on 10/15/2017 12/07/16   Claiborne Rigg, NP  Lurasidone HCl (LATUDA) 60 MG TABS Take 60 mg at bedtime by mouth.    [provider]  sertraline (ZOLOFT) 100 MG tablet Take 100 mg daily by mouth.    [provider]  traZODone (DESYREL) 50 MG tablet Take 50 mg at bedtime by mouth.    [provider]    Family History Family History  Problem Relation Age of Onset  . Diabetes Mother   . Diabetes Father     Social History Social History   Tobacco Use  . Smoking status: Never Smoker  . Smokeless tobacco: Never Used  Substance Use Topics  . Alcohol use: No    Frequency: Never  . Drug use: No     Allergies     Patient has no known allergies.   Review of Systems Review of Systems  All other systems reviewed and are negative.    Physical Exam Updated Vital Signs BP 113/68 (BP Location: Right Arm)   Pulse 84   Temp 97.8 F (36.6 C) (Oral)   Resp 19   SpO2 100%   Physical Exam Vitals signs and nursing note reviewed.  Constitutional:      General: She is not in acute distress.    Appearance: Normal appearance. She is well-developed. She is not toxic-appearing.  HENT:     Head: Normocephalic and atraumatic.  Eyes:     General: Lids are normal.     Conjunctiva/sclera: Conjunctivae normal.     Pupils: Pupils are equal, round, and reactive to light.  Neck:     Musculoskeletal: Normal range of motion and neck supple.     Thyroid: No thyroid mass.     Trachea: No tracheal deviation.  Cardiovascular:     Rate and Rhythm: Normal rate and regular rhythm.     Heart sounds: Normal heart sounds. No murmur. No gallop.   Pulmonary:     Effort: Pulmonary effort is normal. No respiratory distress.     Breath sounds: Normal breath sounds. No stridor. No decreased breath sounds, wheezing, rhonchi  or rales.  Abdominal:     General: Bowel sounds are normal. There is no distension.     Palpations: Abdomen is soft.     Tenderness: There is no abdominal tenderness. There is no rebound.  Musculoskeletal: Normal range of motion.        General: No tenderness.  Skin:    General: Skin is warm and dry.     Findings: No abrasion or rash.  Neurological:     Mental Status: She is alert and oriented to person, place, and time.     GCS: GCS eye subscore is 4. GCS verbal subscore is 5. GCS motor subscore is 6.     Cranial Nerves: No cranial nerve deficit.     Sensory: No sensory deficit.  Psychiatric:        Mood and Affect: Mood is anxious.        Speech: Speech is rapid and pressured.        Behavior: Behavior is hyperactive.        Thought Content: Thought content does not include homicidal or  suicidal ideation. Thought content does not include homicidal or suicidal plan.      ED Treatments / Results  Labs (all labs ordered are listed, but only abnormal results are displayed) Labs Reviewed - No data to display  EKG None  Radiology No results found.  Procedures Procedures (including critical care time)  Medications Ordered in ED Medications  LORazepam (ATIVAN) injection 1 mg (has no administration in time range)     Initial Impression / Assessment and Plan / ED Course  I have reviewed the triage vital signs and the nursing notes.  Pertinent labs & imaging results that were available during my care of the patient were reviewed by me and considered in my medical decision making (see chart for details).        Patient given Ativan here and feels better.  Reassessed and she is much more calm and relaxed.  Denies any SI or HI.  No evidence of psychosis.  Will be discharged home and she will see her therapist this week.  Final Clinical Impressions(s) / ED Diagnoses   Final diagnoses:  None    ED Discharge Orders    None       Lorre Nick, MD 03/21/18 1806

## 2018-03-21 NOTE — ED Triage Notes (Signed)
Pt transported from home, reports anxiety attack.  Pt has hx of same.  Paramedics reports pt's son is hovering over the pt, and tries to answer questions for pt.  When this nurse assisted pt to the BR, pt reports they are moving d/t "bad neighbors and wants to hurt [place], especially my son."  She reports that her son is stressing a lot about this and has threatened to leave her and her daughter, she states "I don't think I can make it if he leaves me."

## 2018-03-21 NOTE — ED Notes (Signed)
Pt refusing to give this RN further information/ explain what is going on.

## 2018-03-21 NOTE — ED Notes (Signed)
Pts son continuously asking multiple people in hall for a cup when told we would grab it for him in just a moment. Son now went to vending machine and pts daughter is attempting to leave room.

## 2018-03-25 DIAGNOSIS — F251 Schizoaffective disorder, depressive type: Secondary | ICD-10-CM | POA: Diagnosis not present

## 2018-05-09 DIAGNOSIS — F333 Major depressive disorder, recurrent, severe with psychotic symptoms: Secondary | ICD-10-CM | POA: Diagnosis not present

## 2018-05-24 DIAGNOSIS — F333 Major depressive disorder, recurrent, severe with psychotic symptoms: Secondary | ICD-10-CM | POA: Diagnosis not present

## 2018-05-24 DIAGNOSIS — F22 Delusional disorders: Secondary | ICD-10-CM | POA: Diagnosis not present

## 2018-05-24 DIAGNOSIS — F209 Schizophrenia, unspecified: Secondary | ICD-10-CM | POA: Diagnosis not present

## 2018-05-24 DIAGNOSIS — F251 Schizoaffective disorder, depressive type: Secondary | ICD-10-CM | POA: Diagnosis not present

## 2018-06-07 DIAGNOSIS — F333 Major depressive disorder, recurrent, severe with psychotic symptoms: Secondary | ICD-10-CM | POA: Diagnosis not present

## 2018-06-29 DIAGNOSIS — F22 Delusional disorders: Secondary | ICD-10-CM | POA: Diagnosis not present

## 2018-07-20 DIAGNOSIS — F251 Schizoaffective disorder, depressive type: Secondary | ICD-10-CM | POA: Diagnosis not present

## 2018-08-09 DIAGNOSIS — F22 Delusional disorders: Secondary | ICD-10-CM | POA: Diagnosis not present

## 2018-08-09 DIAGNOSIS — F251 Schizoaffective disorder, depressive type: Secondary | ICD-10-CM | POA: Diagnosis not present

## 2018-08-09 DIAGNOSIS — F333 Major depressive disorder, recurrent, severe with psychotic symptoms: Secondary | ICD-10-CM | POA: Diagnosis not present

## 2018-08-09 DIAGNOSIS — F209 Schizophrenia, unspecified: Secondary | ICD-10-CM | POA: Diagnosis not present

## 2018-08-15 DIAGNOSIS — F251 Schizoaffective disorder, depressive type: Secondary | ICD-10-CM | POA: Diagnosis not present

## 2018-09-08 DIAGNOSIS — F251 Schizoaffective disorder, depressive type: Secondary | ICD-10-CM | POA: Diagnosis not present

## 2018-10-03 DIAGNOSIS — F333 Major depressive disorder, recurrent, severe with psychotic symptoms: Secondary | ICD-10-CM | POA: Diagnosis not present

## 2018-10-03 DIAGNOSIS — F251 Schizoaffective disorder, depressive type: Secondary | ICD-10-CM | POA: Diagnosis not present

## 2018-10-31 DIAGNOSIS — F22 Delusional disorders: Secondary | ICD-10-CM | POA: Diagnosis not present

## 2018-10-31 DIAGNOSIS — F251 Schizoaffective disorder, depressive type: Secondary | ICD-10-CM | POA: Diagnosis not present

## 2018-10-31 DIAGNOSIS — F333 Major depressive disorder, recurrent, severe with psychotic symptoms: Secondary | ICD-10-CM | POA: Diagnosis not present

## 2018-10-31 DIAGNOSIS — F209 Schizophrenia, unspecified: Secondary | ICD-10-CM | POA: Diagnosis not present

## 2019-01-10 ENCOUNTER — Encounter: Payer: Self-pay | Admitting: Podiatry

## 2019-01-10 ENCOUNTER — Ambulatory Visit (INDEPENDENT_AMBULATORY_CARE_PROVIDER_SITE_OTHER): Payer: Medicare Other

## 2019-01-10 ENCOUNTER — Other Ambulatory Visit: Payer: Self-pay | Admitting: Podiatry

## 2019-01-10 ENCOUNTER — Ambulatory Visit (INDEPENDENT_AMBULATORY_CARE_PROVIDER_SITE_OTHER): Payer: Medicare Other | Admitting: Podiatry

## 2019-01-10 ENCOUNTER — Other Ambulatory Visit: Payer: Self-pay

## 2019-01-10 VITALS — BP 120/78 | HR 66

## 2019-01-10 DIAGNOSIS — S93401A Sprain of unspecified ligament of right ankle, initial encounter: Secondary | ICD-10-CM | POA: Diagnosis not present

## 2019-01-10 DIAGNOSIS — M779 Enthesopathy, unspecified: Secondary | ICD-10-CM

## 2019-01-10 MED ORDER — MELOXICAM 15 MG PO TABS: 15.0000 mg | ORAL_TABLET | Freq: Every day | ORAL | 1 refills | Status: AC

## 2019-01-16 NOTE — Progress Notes (Signed)
   Subjective:  58 y.o. female presenting today as a new patient with a chief complaint right ankle pain that began two weeks ago secondary to an injury. She states she twisted her ankle getting out of the bath tub. She notes the pain is located to the medial ankle and radiates up the leg. She reports associated swelling and pain that is exacerbated by applying pressure. She has not done anything at home for treatment. Patient is here for further evaluation and treatment.   Past Medical History:  Diagnosis Date  . Anxiety   . Depression      Objective / Physical Exam:  General:  The patient is alert and oriented x3 in no acute distress. Dermatology:  Skin is warm, dry and supple bilateral lower extremities. Negative for open lesions or macerations. Vascular:  Palpable pedal pulses bilaterally. No erythema noted. Capillary refill within normal limits. Neurological:  Epicritic and protective threshold grossly intact bilaterally.  Musculoskeletal Exam:  Pain on palpation to the anterior lateral medial aspects of the patient's right ankle. Mild edema noted. Range of motion within normal limits to all pedal and ankle joints bilateral. Muscle strength 5/5 in all groups bilateral.   Radiographic Exam:  Normal osseous mineralization. Joint spaces preserved. No fracture/dislocation/boney destruction.    Assessment: 1. Right ankle sprain   Plan of Care:  1. Patient was evaluated. X-Rays reviewed.  2. CAM boot dispensed to use for two weeks.  3. Prescription for Meloxicam provided to patient. 4. Return to clinic as needed.    Edrick Kins, DPM [facility]  Dr. Edrick Kins, [address]                                        [place], [place] [address]                Office [phone]  Fax [phone]

## 2019-01-31 DIAGNOSIS — F22 Delusional disorders: Secondary | ICD-10-CM | POA: Diagnosis not present

## 2019-01-31 DIAGNOSIS — F251 Schizoaffective disorder, depressive type: Secondary | ICD-10-CM | POA: Diagnosis not present

## 2019-01-31 DIAGNOSIS — F333 Major depressive disorder, recurrent, severe with psychotic symptoms: Secondary | ICD-10-CM | POA: Diagnosis not present

## 2019-01-31 DIAGNOSIS — F209 Schizophrenia, unspecified: Secondary | ICD-10-CM | POA: Diagnosis not present

## 2019-02-13 DIAGNOSIS — F333 Major depressive disorder, recurrent, severe with psychotic symptoms: Secondary | ICD-10-CM | POA: Diagnosis not present

## 2019-03-10 DIAGNOSIS — F333 Major depressive disorder, recurrent, severe with psychotic symptoms: Secondary | ICD-10-CM | POA: Diagnosis not present

## 2019-03-10 DIAGNOSIS — F22 Delusional disorders: Secondary | ICD-10-CM | POA: Diagnosis not present

## 2019-04-03 ENCOUNTER — Ambulatory Visit
Admission: EM | Admit: 2019-04-03 | Discharge: 2019-04-03 | Disposition: A | Discharge status: Home / Self Care | Payer: Medicare Other | Attending: Emergency Medicine | Admitting: Emergency Medicine

## 2019-04-03 ENCOUNTER — Other Ambulatory Visit: Payer: Self-pay

## 2019-04-03 ENCOUNTER — Encounter: Payer: Self-pay | Admitting: Emergency Medicine

## 2019-04-03 DIAGNOSIS — H1033 Unspecified acute conjunctivitis, bilateral: Secondary | ICD-10-CM | POA: Diagnosis not present

## 2019-04-03 DIAGNOSIS — H109 Unspecified conjunctivitis: Secondary | ICD-10-CM

## 2019-04-03 HISTORY — DX: Hyperlipidemia, unspecified: E78.5

## 2019-04-03 MED ORDER — POLYMYXIN B-TRIMETHOPRIM 10000-0.1 UNIT/ML-% OP SOLN: 1.0000 [drp] | Freq: Four times a day (QID) | OPHTHALMIC | 0 refills | Status: AC

## 2019-04-03 NOTE — Discharge Instructions (Addendum)
Use the antibiotic eyedrops as prescribed.    Follow-up with your eye doctor for a recheck in 1 to 2 days if your symptoms are not improving.    Go to the emergency department if you have acute eye pain or changes in your vision.    

## 2019-04-03 NOTE — ED Provider Notes (Signed)
Sandra Miranda    CSN: [id number] Arrival date & time: 04/03/19  1605      History   Chief Complaint Chief Complaint  Patient presents with  . Eye Problem    HPI Sandra Miranda is a 59 y.o. female.   Patient presents with 3-day history of bilateral eyelid irritation, itching, redness, discomfort, and swelling; R>L.  She also reports crusting in her eyes in the mornings and tearing throughout the day.  No injury.  No foreign body sensation.  She denies acute eye pain or changes in her vision.  She thought her symptoms may be due to cat dander and attempted treatment with Benadryl without relief.  She denies fever, chills, or other symptoms.    The history is provided by the patient.    Past Medical History:  Diagnosis Date  . Anxiety   . Depression   . Hyperlipidemia     Patient Active Problem List   Diagnosis Date Noted  . Anxiety and depression 12/02/2016    Past Surgical History:  Procedure Laterality Date  . CYST REMOVAL NECK      OB History   No obstetric history on file.      Home Medications    Prior to Admission medications   Medication Sig Start Date End Date Taking? Authorizing Provider  hydrOXYzine (ATARAX/VISTARIL) 25 MG tablet Take 25 mg by mouth 3 (three) times daily as needed.   Yes [provider]  Lurasidone HCl (LATUDA) 60 MG TABS Take 60 mg at bedtime by mouth.   Yes [provider]  sertraline (ZOLOFT) 100 MG tablet Take 100 mg daily by mouth.   Yes [provider]  traZODone (DESYREL) 50 MG tablet Take 50 mg at bedtime by mouth.   Yes [provider]  atorvastatin (LIPITOR) 20 MG tablet Take 1 tablet (20 mg total) by mouth daily. Patient not taking: Reported on 01/10/2019 10/17/17   Claiborne Rigg, NP  ibuprofen (ADVIL,MOTRIN) 800 MG tablet Take 1 tablet (800 mg total) every 8 (eight) hours as needed by mouth. Patient not taking: Reported on 10/15/2017 12/07/16   Claiborne Rigg, NP  meloxicam  (MOBIC) 15 MG tablet Take 1 tablet (15 mg total) by mouth daily. 01/10/19   Felecia Shelling, DPM  trimethoprim-polymyxin b (POLYTRIM) ophthalmic solution Place 1 drop into both eyes 4 (four) times daily for 7 days. 04/03/19 04/10/19  Mickie Bail, NP    Family History Family History  Problem Relation Age of Onset  . Diabetes Mother   . Heart disease Mother   . Diabetes Father   . Prostate cancer Father     Social History Social History   Tobacco Use  . Smoking status: Never Smoker  . Smokeless tobacco: Never Used  Substance Use Topics  . Alcohol use: No  . Drug use: No     Allergies   Patient has no known allergies.   Review of Systems Review of Systems  Constitutional: Negative for chills and fever.  HENT: Negative for ear pain and sore throat.   Eyes: Positive for redness and itching. Negative for pain and visual disturbance.  Respiratory: Negative for cough and shortness of breath.   Cardiovascular: Negative for chest pain and palpitations.  Gastrointestinal: Negative for abdominal pain and vomiting.  Genitourinary: Negative for dysuria and hematuria.  Musculoskeletal: Negative for arthralgias and back pain.  Skin: Negative for color change and rash.  Neurological: Negative for seizures and syncope.  All other systems reviewed  and are negative.    Physical Exam Triage Vital Signs ED Triage Vitals  Enc Vitals Group     BP      Pulse      Resp      Temp      Temp src      SpO2      Weight      Height      Head Circumference      Peak Flow      Pain Score      Pain Loc      Pain Edu?      Excl. in GC?    No data found.  Updated Vital Signs BP 123/78 (BP Location: Left Arm)   Pulse 66   Temp 98.8 F (37.1 C) (Oral)   Resp 18   Ht 5\' 4"  (1.626 m)   Wt 140 lb (63.5 kg)   SpO2 98%   BMI 24.03 kg/m   Visual Acuity Right Eye Distance: 20/30 Left Eye Distance: 20/25 Bilateral Distance: 20/20  Right Eye Near:   Left Eye Near:    Bilateral  Near:     Physical Exam Vitals and nursing note reviewed.  Constitutional:      General: She is not in acute distress.    Appearance: She is well-developed.  HENT:     Head: Normocephalic and atraumatic.     Right Ear: Tympanic membrane normal.     Left Ear: Tympanic membrane normal.     Nose: Nose normal.     Mouth/Throat:     Mouth: Mucous membranes are moist.     Pharynx: Oropharynx is clear.  Eyes:     General: Lids are normal. Vision grossly intact.     Extraocular Movements: Extraocular movements intact.     Conjunctiva/sclera:     Right eye: Right conjunctiva is injected.     Left eye: Left conjunctiva is injected.  Cardiovascular:     Rate and Rhythm: Normal rate and regular rhythm.     Heart sounds: No murmur.  Pulmonary:     Effort: Pulmonary effort is normal. No respiratory distress.     Breath sounds: Normal breath sounds.  Abdominal:     Palpations: Abdomen is soft.     Tenderness: There is no abdominal tenderness. There is no guarding or rebound.  Musculoskeletal:     Cervical back: Neck supple.  Skin:    General: Skin is warm and dry.     Findings: No rash.  Neurological:     General: No focal deficit present.     Mental Status: She is alert and oriented to person, place, and time.      UC Treatments / Results  Labs (all labs ordered are listed, but only abnormal results are displayed) Labs Reviewed - No data to display  EKG   Radiology No results found.  Procedures Procedures (including critical care time)  Medications Ordered in UC Medications - No data to display  Initial Impression / Assessment and Plan / UC Course  I have reviewed the triage vital signs and the nursing notes.  Pertinent labs & imaging results that were available during my care of the patient were reviewed by me and considered in my medical decision making (see chart for details).    Bilateral conjunctivitis.  Treating with polytrim eye drops.  Directed patient to  follow-up with her eye care doctor for recheck in 1 to 2 days if her symptoms or not improving.  Instructed  her to go to the ED if she has acute eye pain or changes in her vision.  Patient agrees to plan of care.     Final Clinical Impressions(s) / UC Diagnoses   Final diagnoses:  Conjunctivitis of both eyes, unspecified conjunctivitis type     Discharge Instructions     Use the antibiotic eyedrops as prescribed.    Follow-up with your eye doctor for a recheck in 1 to 2 days if your symptoms are not improving.      Go to the emergency department if you have acute eye pain or changes in your vision.        ED Prescriptions    Medication Sig Dispense Auth. Provider   trimethoprim-polymyxin b (POLYTRIM) ophthalmic solution Place 1 drop into both eyes 4 (four) times daily for 7 days. 10 mL Sharion Balloon, NP     PDMP not reviewed this encounter.   Sharion Balloon, NP 04/03/19 [phone]

## 2019-04-03 NOTE — ED Triage Notes (Signed)
Patient in today c/o bilateral eye irritation, pain, redness and swelling of the lids x 3 days. R>L Patient took Benadryl without relief.

## 2019-04-10 DIAGNOSIS — F251 Schizoaffective disorder, depressive type: Secondary | ICD-10-CM | POA: Diagnosis not present

## 2019-04-10 DIAGNOSIS — F333 Major depressive disorder, recurrent, severe with psychotic symptoms: Secondary | ICD-10-CM | POA: Diagnosis not present

## 2019-04-25 DIAGNOSIS — F251 Schizoaffective disorder, depressive type: Secondary | ICD-10-CM | POA: Diagnosis not present

## 2019-04-25 DIAGNOSIS — F333 Major depressive disorder, recurrent, severe with psychotic symptoms: Secondary | ICD-10-CM | POA: Diagnosis not present

## 2019-04-25 DIAGNOSIS — F209 Schizophrenia, unspecified: Secondary | ICD-10-CM | POA: Diagnosis not present

## 2019-04-25 DIAGNOSIS — F22 Delusional disorders: Secondary | ICD-10-CM | POA: Diagnosis not present

## 2019-05-25 DIAGNOSIS — F251 Schizoaffective disorder, depressive type: Secondary | ICD-10-CM | POA: Diagnosis not present

## 2019-05-25 DIAGNOSIS — F333 Major depressive disorder, recurrent, severe with psychotic symptoms: Secondary | ICD-10-CM | POA: Diagnosis not present

## 2019-05-31 IMAGING — MG DIGITAL SCREENING BILATERAL MAMMOGRAM WITH TOMO AND CAD
8 series · 9 of 24 positions shown · non-contrast
Comparison: Previous exam(s).

CLINICAL DATA: Screening.

EXAM:
DIGITAL SCREENING BILATERAL MAMMOGRAM WITH TOMO AND CAD

[L MLO synth-2D]
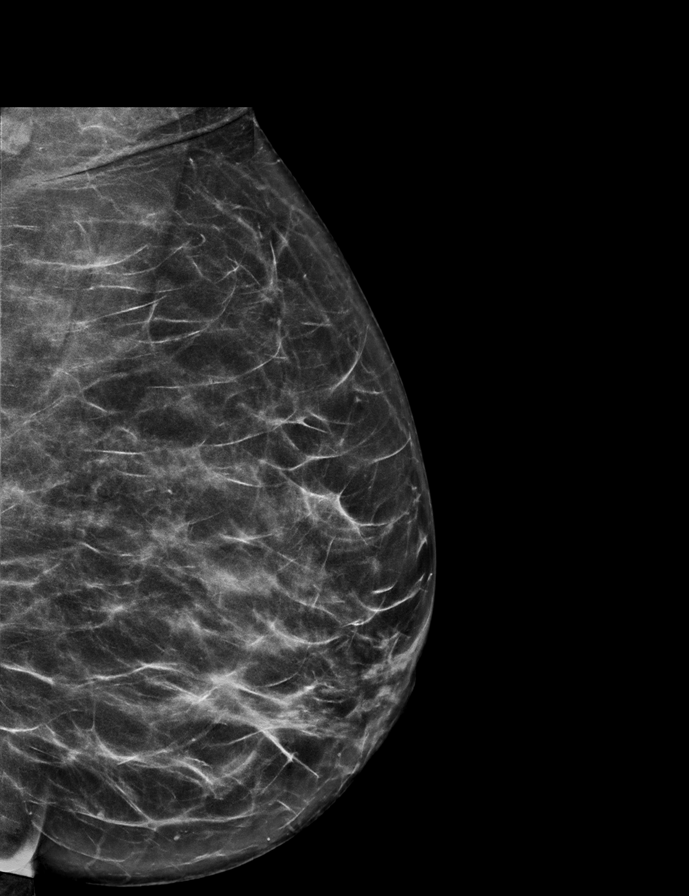

[R CC synth-2D]
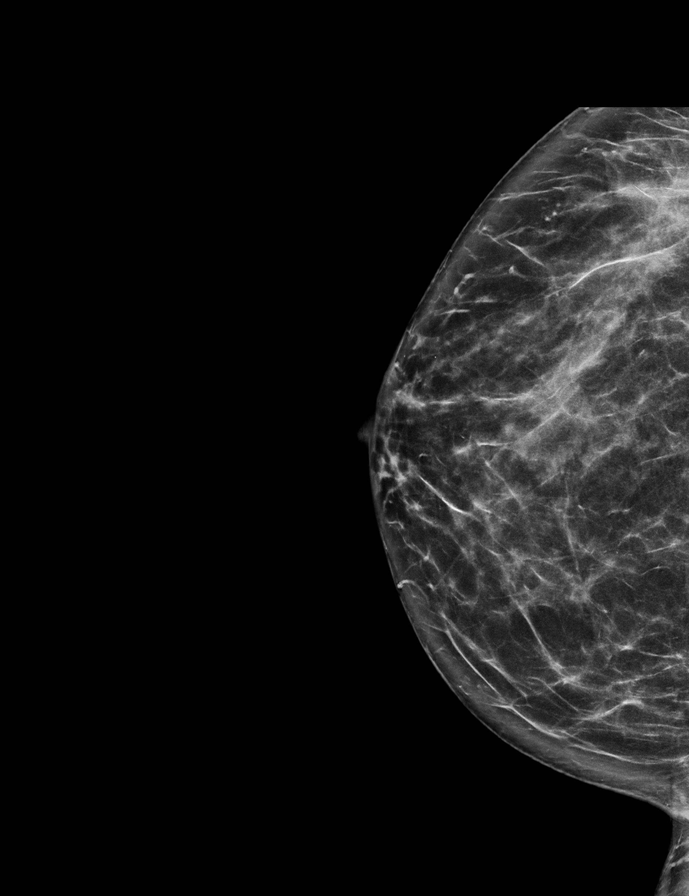

[R MLO synth-2D]
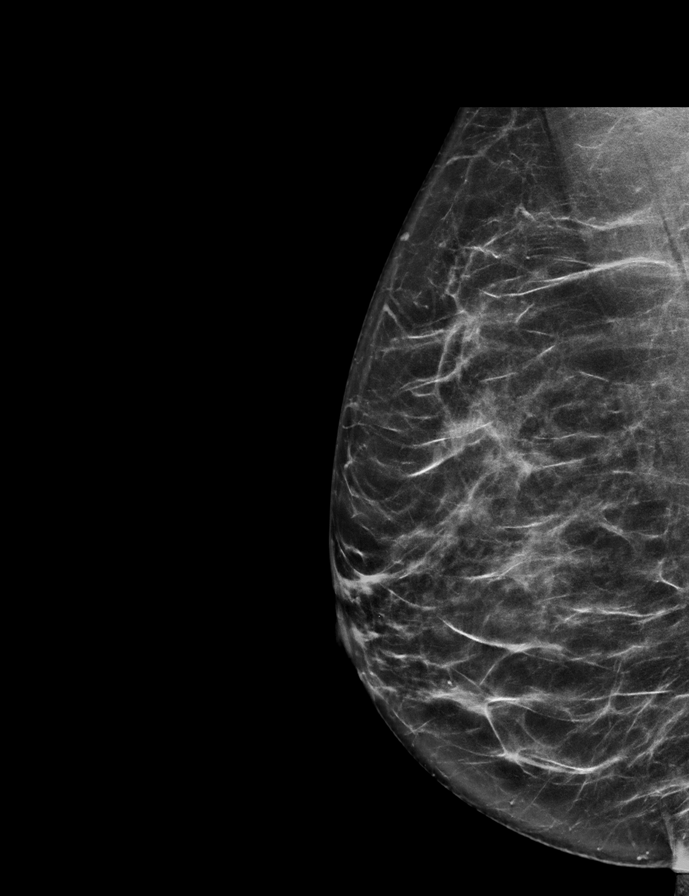

[L CC synth-2D]
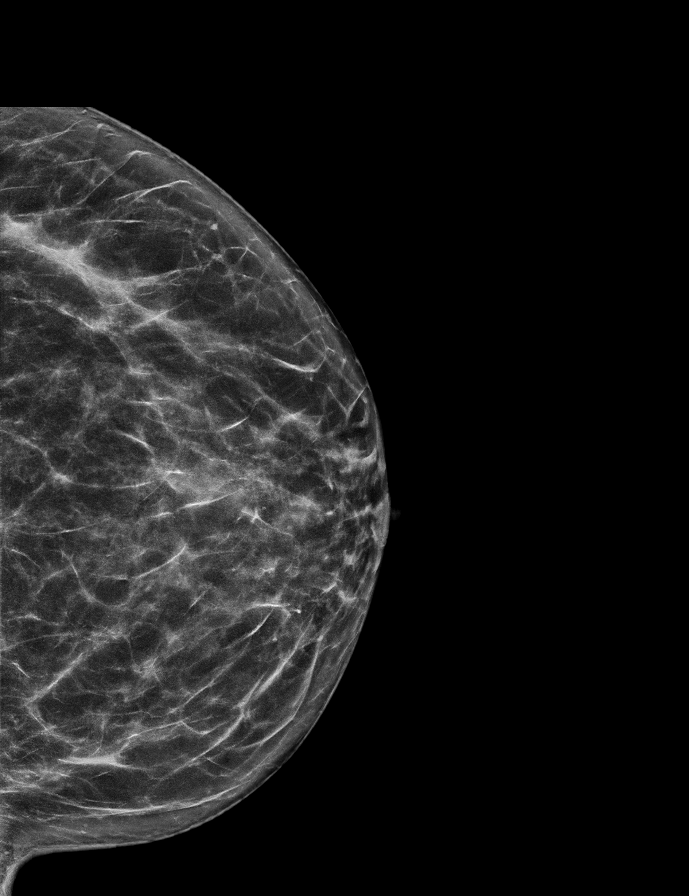

[R MLO tomo · 2 of 68 frames shown]
[frame 22/68]
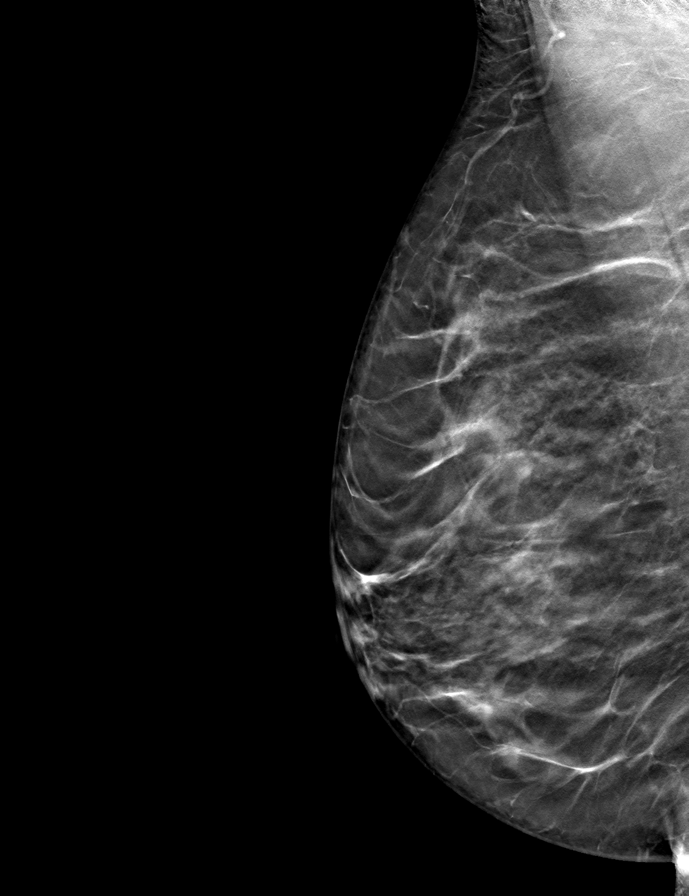
[frame 35/68]
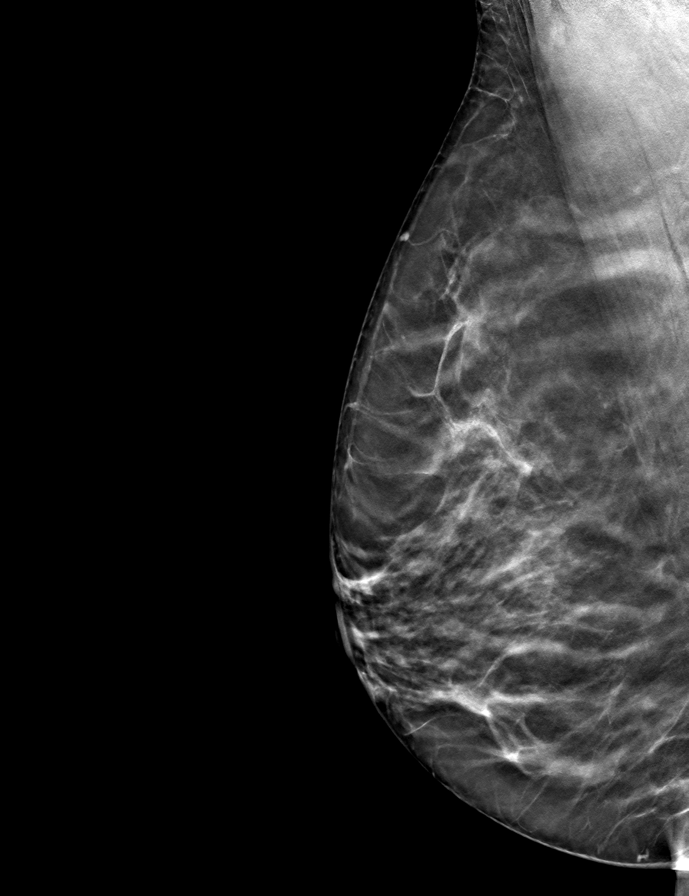

[R CC tomo · tomo slice 33/65.0]
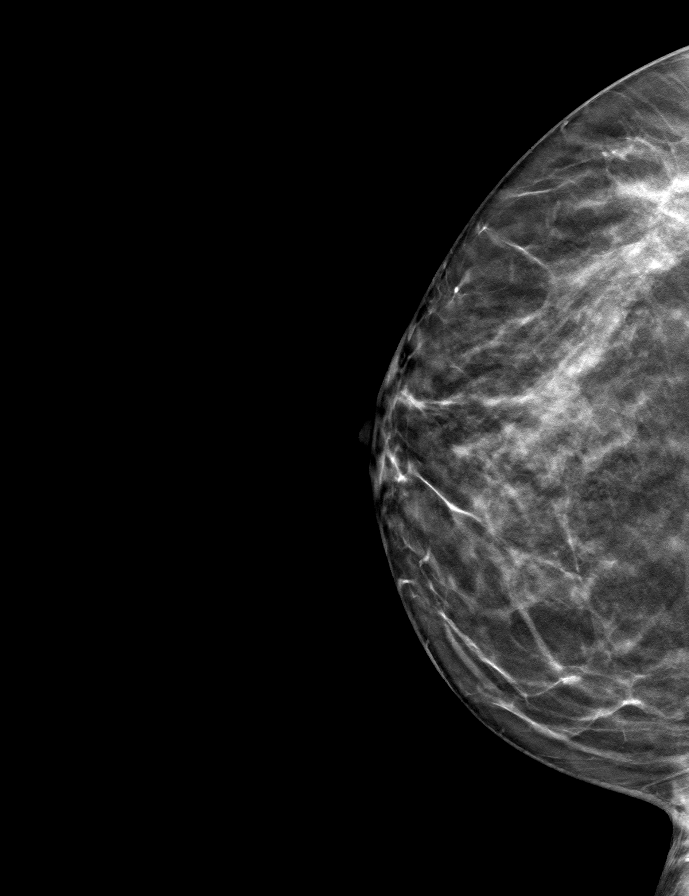

[L CC tomo · tomo slice 33/64.0]
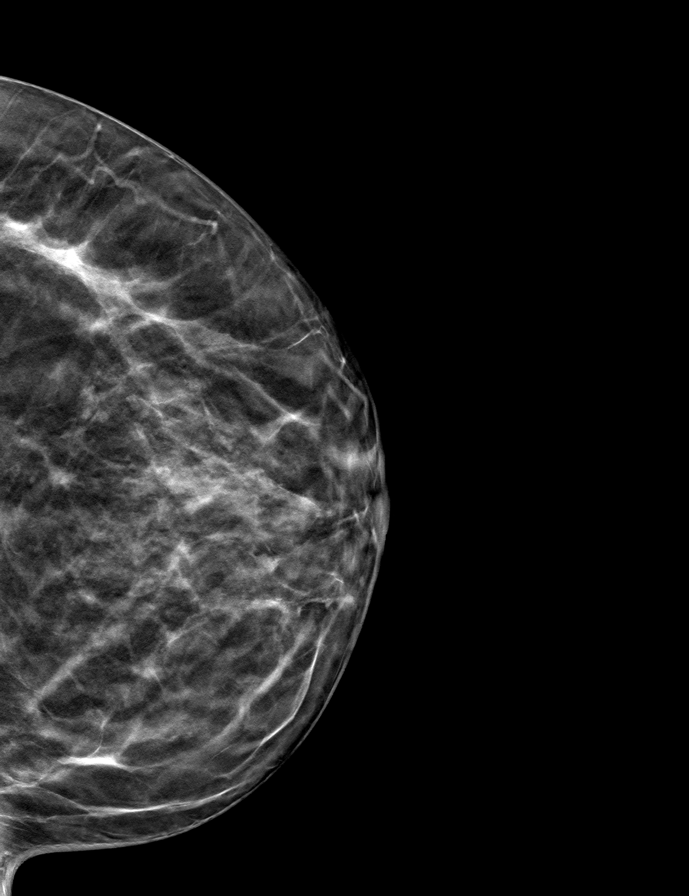

[L MLO tomo · tomo slice 35/69.0]
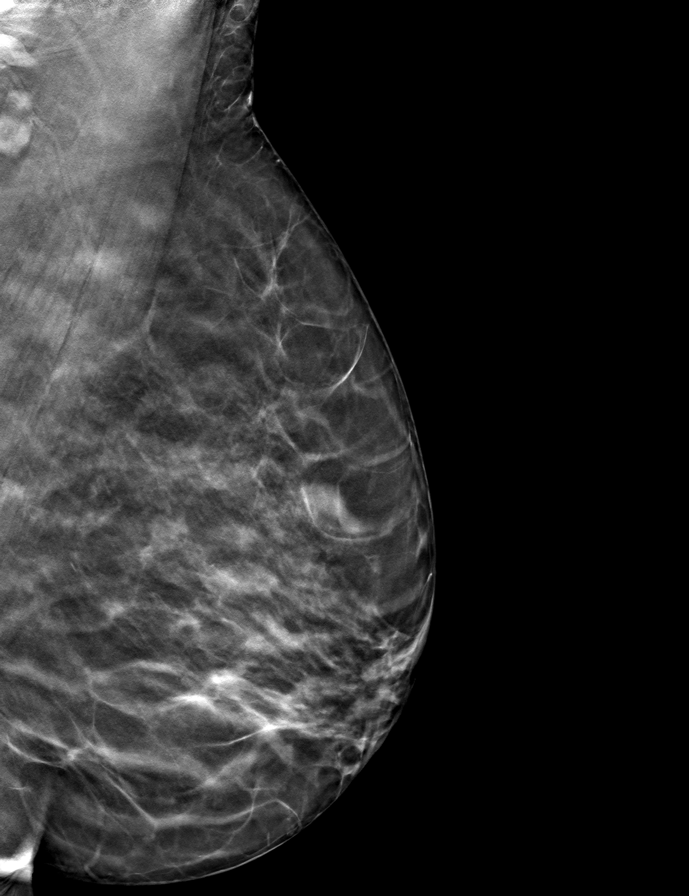

[9 of 24 positions shown; findings below may reference images not displayed]

ACR Breast Density Category b: There are scattered areas of
fibroglandular density.
FINDINGS: There are no findings suspicious for malignancy. Images were
processed with CAD.
IMPRESSION: No mammographic evidence of malignancy. A result letter of this
screening mammogram will be mailed directly to the patient.

RECOMMENDATION:
Screening mammogram in one year. (Code:[id number])

BI-RADS CATEGORY  1: Negative.

## 2019-07-20 DIAGNOSIS — F333 Major depressive disorder, recurrent, severe with psychotic symptoms: Secondary | ICD-10-CM | POA: Diagnosis not present

## 2019-07-20 DIAGNOSIS — F251 Schizoaffective disorder, depressive type: Secondary | ICD-10-CM | POA: Diagnosis not present

## 2019-08-09 DIAGNOSIS — F251 Schizoaffective disorder, depressive type: Secondary | ICD-10-CM | POA: Diagnosis not present

## 2019-08-09 DIAGNOSIS — F333 Major depressive disorder, recurrent, severe with psychotic symptoms: Secondary | ICD-10-CM | POA: Diagnosis not present

## 2019-10-26 DIAGNOSIS — F22 Delusional disorders: Secondary | ICD-10-CM | POA: Diagnosis not present

## 2019-10-26 DIAGNOSIS — F251 Schizoaffective disorder, depressive type: Secondary | ICD-10-CM | POA: Diagnosis not present

## 2019-10-26 DIAGNOSIS — F333 Major depressive disorder, recurrent, severe with psychotic symptoms: Secondary | ICD-10-CM | POA: Diagnosis not present

## 2019-10-26 DIAGNOSIS — F209 Schizophrenia, unspecified: Secondary | ICD-10-CM | POA: Diagnosis not present

## 2020-01-22 DIAGNOSIS — F333 Major depressive disorder, recurrent, severe with psychotic symptoms: Secondary | ICD-10-CM | POA: Diagnosis not present

## 2020-01-22 DIAGNOSIS — F251 Schizoaffective disorder, depressive type: Secondary | ICD-10-CM | POA: Diagnosis not present

## 2020-01-24 DIAGNOSIS — F333 Major depressive disorder, recurrent, severe with psychotic symptoms: Secondary | ICD-10-CM | POA: Diagnosis not present

## 2020-01-24 DIAGNOSIS — F251 Schizoaffective disorder, depressive type: Secondary | ICD-10-CM | POA: Diagnosis not present

## 2020-01-24 DIAGNOSIS — F22 Delusional disorders: Secondary | ICD-10-CM | POA: Diagnosis not present

## 2020-01-24 DIAGNOSIS — F209 Schizophrenia, unspecified: Secondary | ICD-10-CM | POA: Diagnosis not present

## 2020-02-01 DIAGNOSIS — F251 Schizoaffective disorder, depressive type: Secondary | ICD-10-CM | POA: Diagnosis not present

## 2020-02-01 DIAGNOSIS — F333 Major depressive disorder, recurrent, severe with psychotic symptoms: Secondary | ICD-10-CM | POA: Diagnosis not present
